# Patient Record
Sex: Male | Born: 1990 | Race: Black or African American | Hispanic: No | Marital: Single | Smoking: Current every day smoker
Health system: Southern US, Community
[De-identification: ages and names within clinical notes are randomized; demographics above are authoritative.]

## PROBLEM LIST (undated history)

## (undated) DIAGNOSIS — J349 Unspecified disorder of nose and nasal sinuses: Secondary | ICD-10-CM

## (undated) HISTORY — PX: HAND SURGERY: SHX662

## (undated) HISTORY — PX: KNEE SURGERY: SHX244

---
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
## 2010-03-14 IMAGING — CR DG CHEST 2V
2 series · 2 of 2 positions shown · non-contrast
Comparison: Chest radiograph 01/14/2009

CLINICAL DATA: Left lower lobe collapse

CHEST - 2 VIEW

[w chest pa]
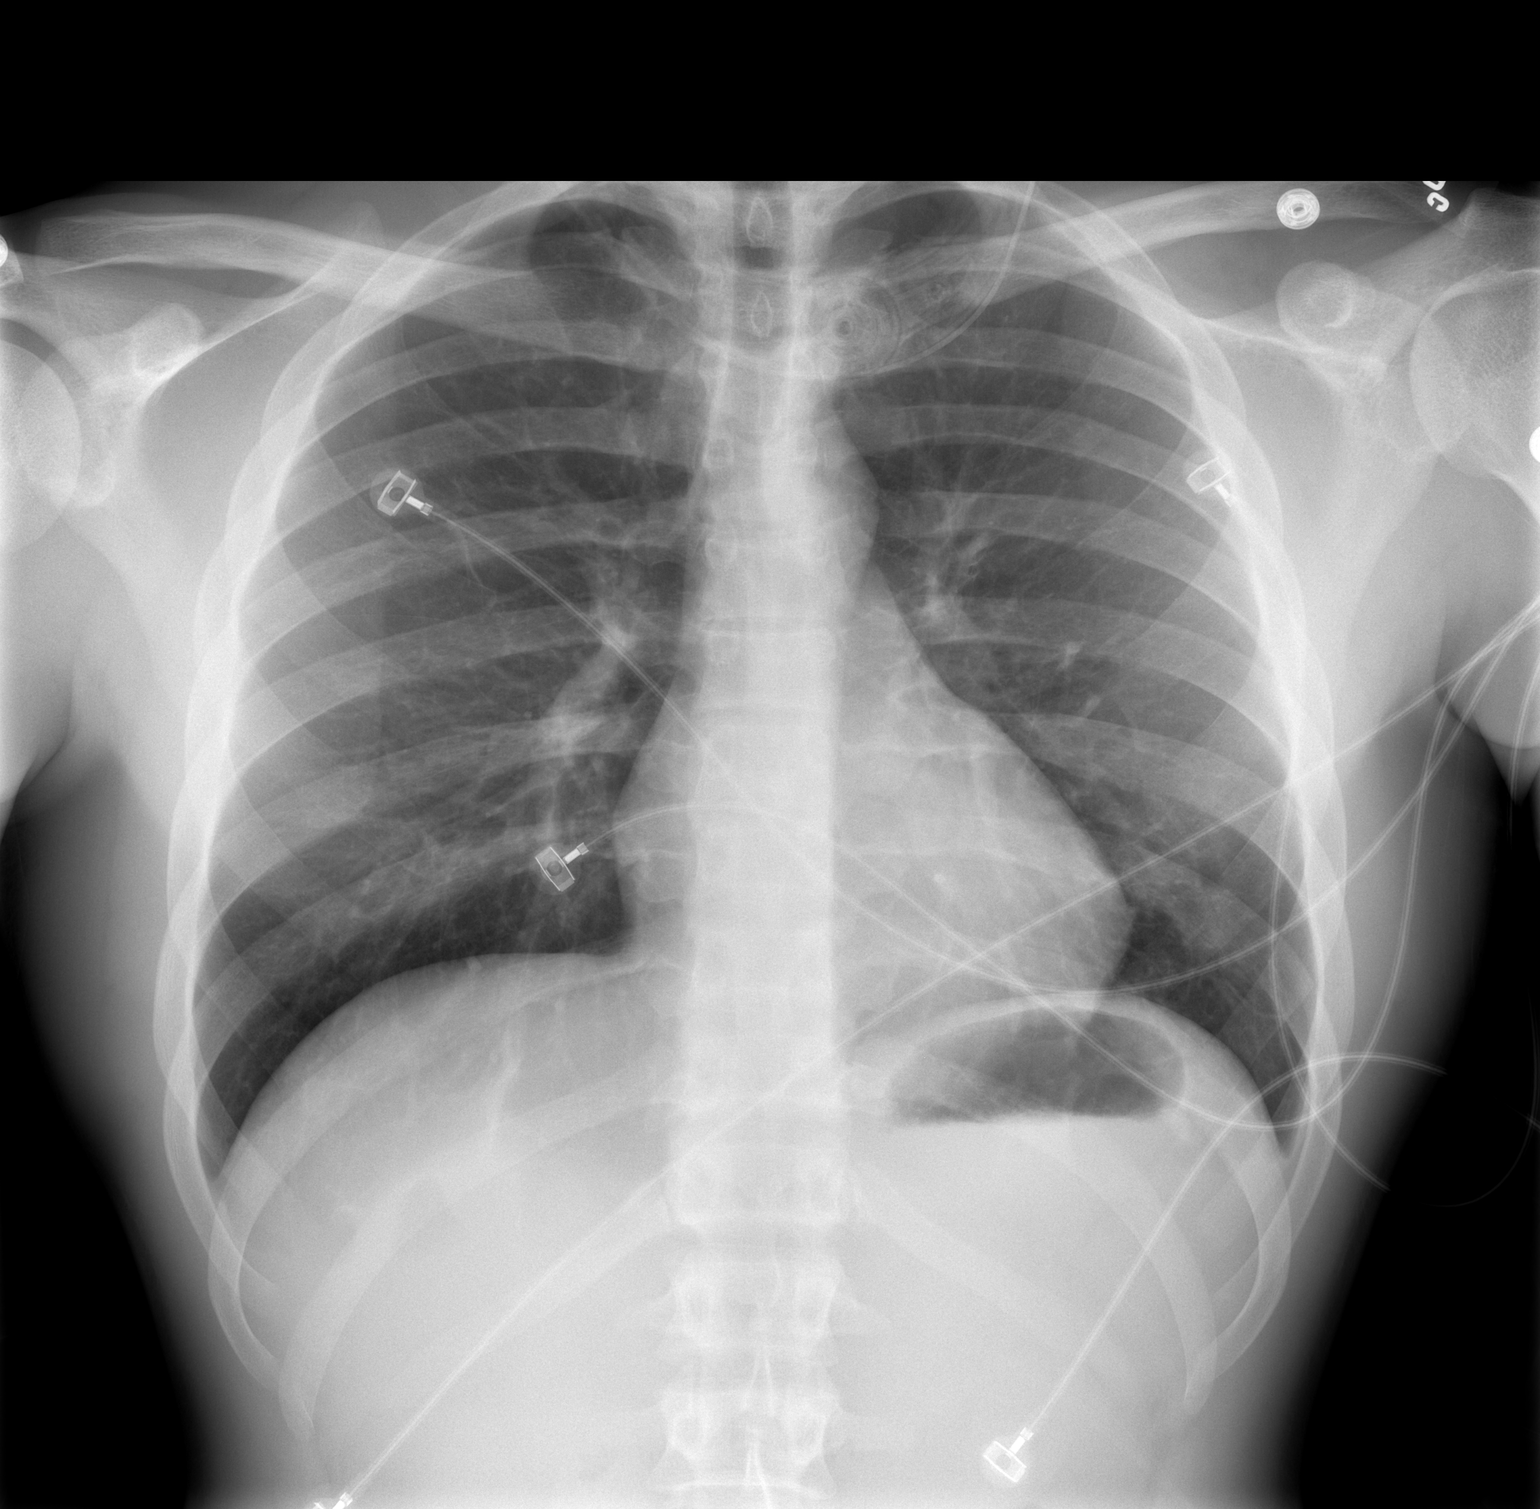

[w chest lat]
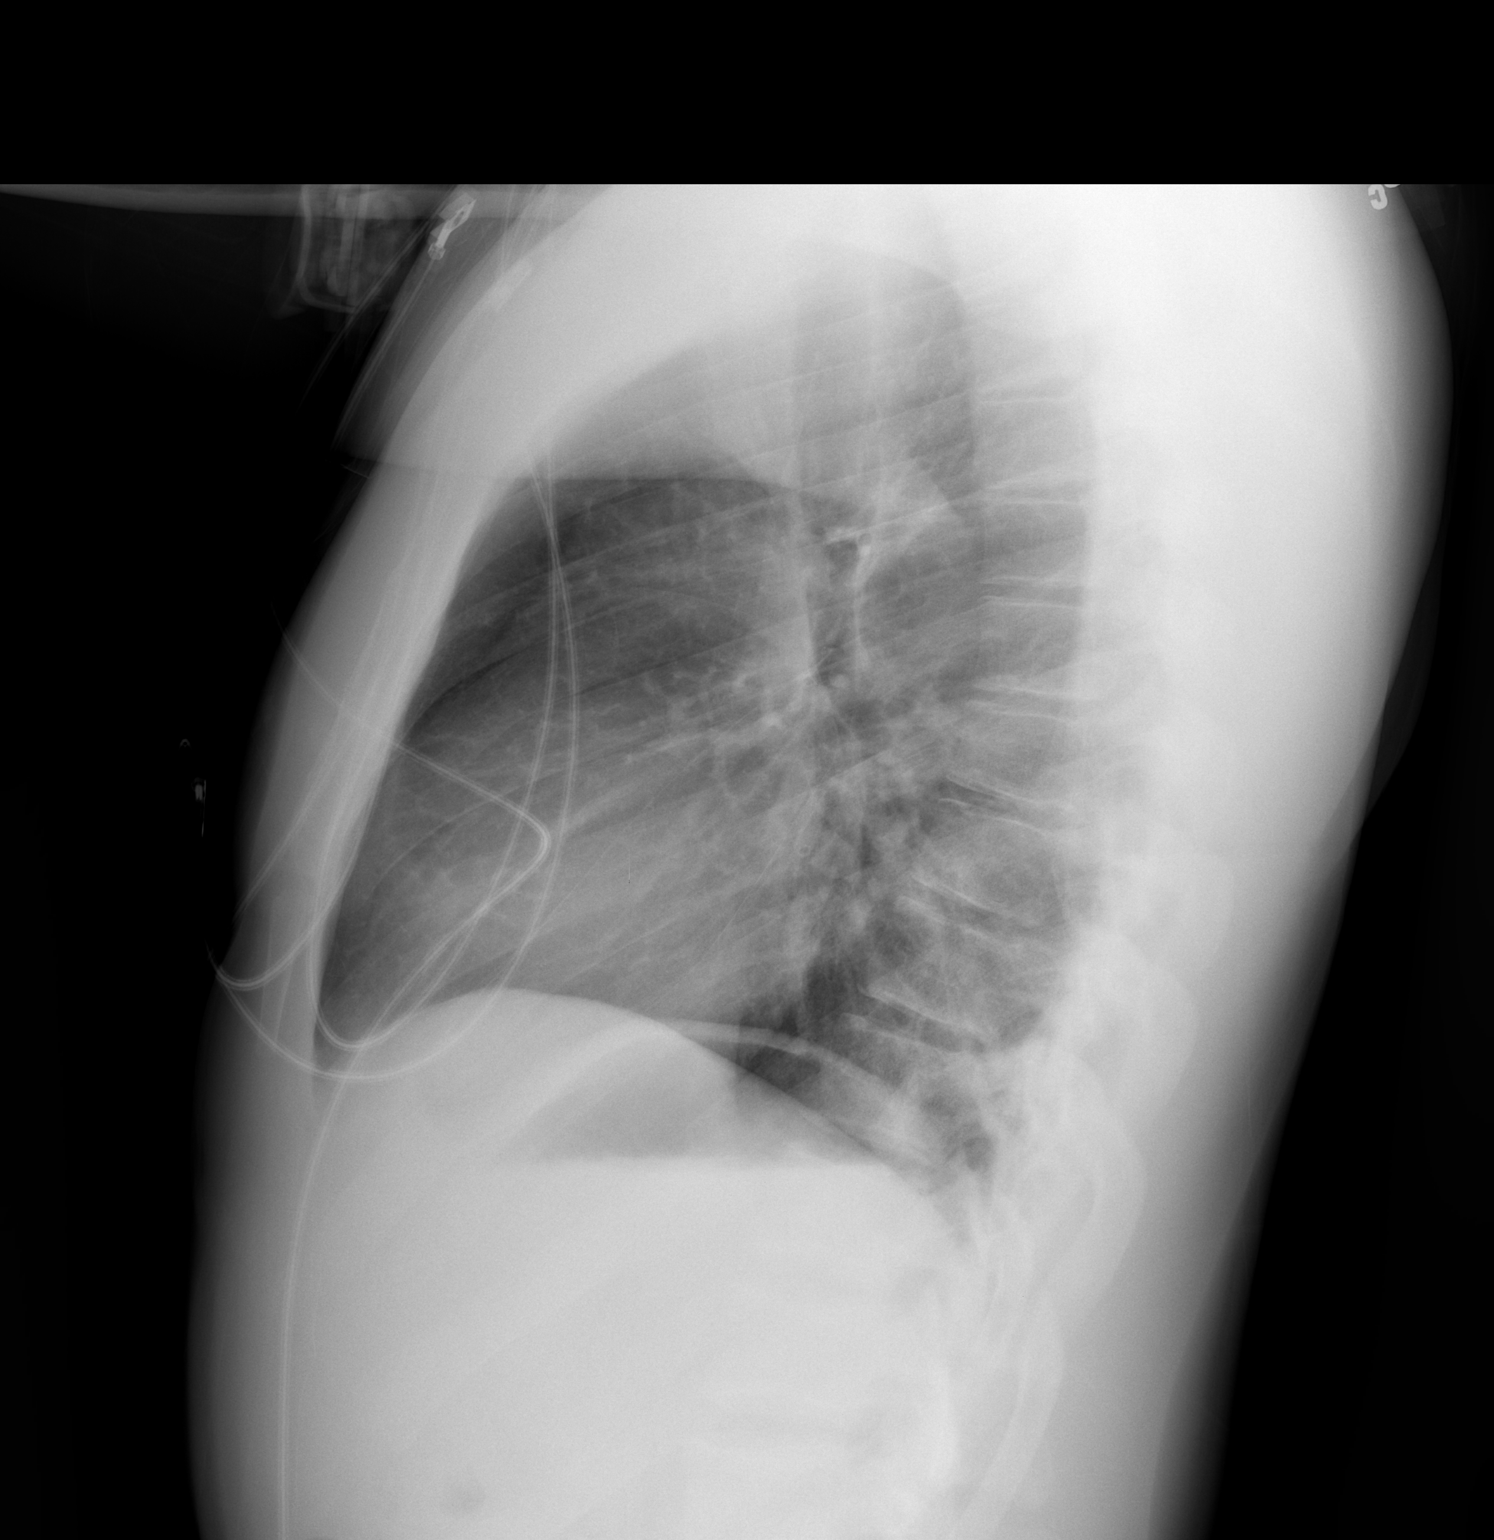

[2 of 2 positions shown; findings below may reference images not displayed]

FINDINGS: Normal mediastinum and cardiac silhouette.  No evidence
of pneumothorax.  Interval resolution of the left lower lobe
atelectasis.  No evidence of  rib fracture. Potential mild residual
atelectasis at the left lung base posteriorly versus small amount
of air space disease or contusion.
IMPRESSION: 1.  Interval expansion of the left lower lobe.
2.  No evidence of pneumothorax.

## 2010-06-07 LAB — CBC
HCT: 41.3 % (ref 39.0–52.0)
HCT: 41.6 % (ref 39.0–52.0)
HCT: 45 % (ref 39.0–52.0)
Hemoglobin: 14.3 g/dL (ref 13.0–17.0)
Hemoglobin: 15.4 g/dL (ref 13.0–17.0)
MCHC: 34.3 g/dL (ref 30.0–36.0)
MCHC: 34.5 g/dL (ref 30.0–36.0)
MCV: 93.6 fL (ref 78.0–100.0)
MCV: 94.6 fL (ref 78.0–100.0)
MCV: 94.6 fL (ref 78.0–100.0)
Platelets: 161 10*3/uL (ref 150–400)
Platelets: 172 10*3/uL (ref 150–400)
Platelets: 188 10*3/uL (ref 150–400)
RBC: 4.81 MIL/uL (ref 4.22–5.81)
RDW: 12.2 % (ref 11.5–15.5)
WBC: 10.3 10*3/uL (ref 4.0–10.5)
WBC: 10.4 10*3/uL (ref 4.0–10.5)
WBC: 14.8 10*3/uL — ABNORMAL HIGH (ref 4.0–10.5)

## 2010-06-07 LAB — TYPE AND SCREEN: Antibody Screen: NEGATIVE

## 2010-06-07 LAB — URINALYSIS, ROUTINE W REFLEX MICROSCOPIC
Nitrite: NEGATIVE
Specific Gravity, Urine: 1.023 (ref 1.005–1.030)
Urobilinogen, UA: 1 mg/dL (ref 0.0–1.0)
pH: 6 (ref 5.0–8.0)

## 2010-06-07 LAB — COMPREHENSIVE METABOLIC PANEL
AST: 52 U/L — ABNORMAL HIGH (ref 0–37)
CO2: 26 mEq/L (ref 19–32)
Calcium: 9.1 mg/dL (ref 8.4–10.5)
Chloride: 108 mEq/L (ref 96–112)
Creatinine, Ser: 1.34 mg/dL (ref 0.4–1.5)
GFR calc non Af Amer: >60 mL/min (ref >60)
Glucose, Bld: 105 mg/dL — ABNORMAL HIGH (ref 70–99)
Total Bilirubin: 1 mg/dL (ref 0.3–1.2)

## 2010-06-07 LAB — PROTIME-INR: Prothrombin Time: 14.1 seconds (ref 11.6–15.2)

## 2010-06-07 LAB — POCT I-STAT, CHEM 8
BUN: 9 mg/dL (ref 6–23)
Calcium, Ion: 1.14 mmol/L (ref 1.12–1.32)
Creatinine, Ser: 1.4 mg/dL (ref 0.4–1.5)
TCO2: 25 mmol/L (ref 0–100)

## 2010-06-07 LAB — POCT I-STAT 3, ART BLOOD GAS (G3+)
O2 Saturation: 100 %
Patient temperature: 98.6
TCO2: 27 mmol/L (ref 0–100)

## 2010-06-07 LAB — URINE MICROSCOPIC-ADD ON

## 2010-06-07 LAB — LACTIC ACID, PLASMA: Lactic Acid, Venous: 2.5 mmol/L — ABNORMAL HIGH (ref 0.5–2.2)

## 2010-06-07 LAB — BASIC METABOLIC PANEL
BUN: 7 mg/dL (ref 6–23)
Creatinine, Ser: 1.23 mg/dL (ref 0.4–1.5)
GFR calc non Af Amer: >60 mL/min (ref >60)

## 2010-06-07 LAB — APTT: aPTT: 30 seconds (ref 24–37)

## 2011-06-14 ENCOUNTER — Encounter (HOSPITAL_COMMUNITY): Payer: Self-pay

## 2011-06-14 ENCOUNTER — Emergency Department (HOSPITAL_COMMUNITY)
Admission: EM | Admit: 2011-06-14 | Discharge: 2011-06-14 | Disposition: A | Discharge status: Home / Self Care | Payer: BC Managed Care – PPO | Attending: Emergency Medicine | Admitting: Emergency Medicine

## 2011-06-14 DIAGNOSIS — R82998 Other abnormal findings in urine: Secondary | ICD-10-CM | POA: Insufficient documentation

## 2011-06-14 DIAGNOSIS — R45851 Suicidal ideations: Secondary | ICD-10-CM | POA: Insufficient documentation

## 2011-06-14 DIAGNOSIS — R8271 Bacteriuria: Secondary | ICD-10-CM

## 2011-06-14 DIAGNOSIS — F17200 Nicotine dependence, unspecified, uncomplicated: Secondary | ICD-10-CM | POA: Insufficient documentation

## 2011-06-14 HISTORY — DX: Unspecified disorder of nose and nasal sinuses: J34.9

## 2011-06-14 LAB — URINALYSIS, ROUTINE W REFLEX MICROSCOPIC
Bilirubin Urine: NEGATIVE
Ketones, ur: NEGATIVE mg/dL
Nitrite: NEGATIVE
Protein, ur: NEGATIVE mg/dL
Urobilinogen, UA: 1 mg/dL (ref 0.0–1.0)

## 2011-06-14 LAB — CBC
HCT: 44.3 % (ref 39.0–52.0)
Hemoglobin: 15.2 g/dL (ref 13.0–17.0)
MCHC: 34.3 g/dL (ref 30.0–36.0)

## 2011-06-14 LAB — BASIC METABOLIC PANEL
BUN: 15 mg/dL (ref 6–23)
CO2: 29 mEq/L (ref 19–32)
GFR calc non Af Amer: 79 mL/min — ABNORMAL LOW (ref >90)
Glucose, Bld: 90 mg/dL (ref 70–99)
Potassium: 4.4 mEq/L (ref 3.5–5.1)

## 2011-06-14 LAB — ETHANOL: Alcohol, Ethyl (B): <11 mg/dL (ref 0–11)

## 2011-06-14 LAB — RAPID URINE DRUG SCREEN, HOSP PERFORMED
Amphetamines: NOT DETECTED
Barbiturates: NOT DETECTED
Benzodiazepines: NOT DETECTED
Tetrahydrocannabinol: POSITIVE — AB

## 2011-06-14 MED ORDER — ACETAMINOPHEN 325 MG PO TABS: 650.0000 mg | ORAL_TABLET | ORAL | Status: DC | PRN

## 2011-06-14 MED ORDER — ONDANSETRON HCL 4 MG PO TABS: 4.0000 mg | ORAL_TABLET | Freq: Three times a day (TID) | ORAL | Status: DC | PRN

## 2011-06-14 MED ORDER — IBUPROFEN 600 MG PO TABS: 600.0000 mg | ORAL_TABLET | Freq: Three times a day (TID) | ORAL | Status: DC | PRN

## 2011-06-14 MED ORDER — NICOTINE 21 MG/24HR TD PT24: 21.0000 mg | MEDICATED_PATCH | Freq: Every day | TRANSDERMAL | Status: DC

## 2011-06-14 MED ORDER — ALUM & MAG HYDROXIDE-SIMETH 200-200-20 MG/5ML PO SUSP: 30.0000 mL | ORAL | Status: DC | PRN
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
## 2011-06-14 NOTE — ED Notes (Signed)
Pt dc'd w/ writen instructions.  Pt encouraged to return/seek treatment for any thoughts of harming his self or others.  Pt verbalized understanding.

## 2011-06-14 NOTE — ED Notes (Signed)
Patient has one bag of belongings in locker 34.

## 2011-06-14 NOTE — ED Notes (Signed)
Pt care assumed, obtained verbal report.  Pt resting comfortably in room.  Family/friend at bedside.  Pt is calm and cooperative at this time.

## 2011-06-14 NOTE — ED Notes (Signed)
PA in to see

## 2011-06-14 NOTE — ED Notes (Signed)
Belongings returned

## 2011-06-14 NOTE — Discharge Instructions (Signed)
As we discussed, you should return to the ER for further treatment if you begin to have thoughts of hurting yourself or anyone else.   There was bacteria in your urine. The sample is being sent to help determine what kind of bacteria is present. Please call the hospital in 4 days if you have not heard anything from [place] to find out your results.

## 2011-06-14 NOTE — ED Notes (Signed)
Artis Flock, PA in room with pt

## 2011-06-14 NOTE — Discharge Planning (Signed)
CSW requested telepsych on patient. Pending return call.  Manson Passey Vanette Noguchi ANN S , MSW, LCSWA 06/14/2011 7:55 AM [phone]
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
## 2011-06-14 NOTE — ED Notes (Signed)
telepsych in progress 

## 2011-06-14 NOTE — ED Provider Notes (Signed)
Medical screening examination/treatment/procedure(s) were performed by non-physician practitioner and as supervising physician I was immediately available for consultation/collaboration.   Analisia Kingsford R Armend Hochstatter, MD 06/14/11 2320 

## 2011-06-15 LAB — GC/CHLAMYDIA PROBE AMP, URINE: GC Probe Amp, Urine: NEGATIVE

## 2011-06-15 LAB — URINE CULTURE
Culture  Setup Time: 201304110843
Special Requests: NORMAL

## 2011-06-16 NOTE — ED Notes (Signed)
+  Chlamydia Chart sent to EDP office for review.  

## 2011-06-16 NOTE — ED Notes (Signed)
+   Urine Chart sent to EDP office for review. 

## 2011-06-21 NOTE — ED Notes (Signed)
Prescription for azithromycin 1000mg  po x1 dose, no refills, called in to [facility] on [address] at [phone] per josh geiple, [place].

## 2011-06-21 NOTE — ED Notes (Signed)
Chart back from EDP office ..prescription for called in to [phone]

## 2015-02-19 ENCOUNTER — Encounter (HOSPITAL_COMMUNITY): Payer: Self-pay | Admitting: Emergency Medicine

## 2015-02-19 ENCOUNTER — Emergency Department (HOSPITAL_COMMUNITY)
Admission: EM | Admit: 2015-02-19 | Discharge: 2015-02-19 | Disposition: A | Discharge status: Home / Self Care | Payer: Commercial Managed Care - HMO | Attending: Emergency Medicine | Admitting: Emergency Medicine

## 2015-02-19 DIAGNOSIS — L989 Disorder of the skin and subcutaneous tissue, unspecified: Secondary | ICD-10-CM | POA: Insufficient documentation

## 2015-02-19 DIAGNOSIS — K6289 Other specified diseases of anus and rectum: Secondary | ICD-10-CM | POA: Diagnosis present

## 2015-02-19 DIAGNOSIS — F17200 Nicotine dependence, unspecified, uncomplicated: Secondary | ICD-10-CM | POA: Diagnosis not present

## 2015-02-19 DIAGNOSIS — N4889 Other specified disorders of penis: Secondary | ICD-10-CM | POA: Diagnosis not present

## 2015-02-19 DIAGNOSIS — K130 Diseases of lips: Secondary | ICD-10-CM | POA: Diagnosis not present

## 2015-02-19 MED ORDER — CLOTRIMAZOLE 1 % EX CREA: TOPICAL_CREAM | CUTANEOUS | Status: DC

## 2015-02-19 MED ORDER — BELLADONNA-OPIUM 16.2-30 MG RE SUPP: 30.0000 mg | Freq: Two times a day (BID) | RECTAL | Status: DC | PRN

## 2015-02-19 MED ORDER — FLUCONAZOLE 100 MG PO TABS
150.0000 mg | ORAL_TABLET | Freq: Once | ORAL | Status: AC
  Administered 2015-02-19: 150 mg via ORAL
  Filled 2015-02-19: qty 2

## 2015-02-19 NOTE — ED Notes (Signed)
Pt st's he has a rash around him rectum that is very irritated.  St's he gets a sharp pain in his rectum.  Pt denies any discharge from penis.

## 2015-02-19 NOTE — Discharge Instructions (Signed)
As discussed, your evaluation today has been largely reassuring. There is evidence for a skin yeast infection. You have received the initial medication for this.  If the skin does not improve, please use the prescribed cream. In addition, please use the prescribed suppositories for additional pain control. If your pain does not improve, please be sure to follow-up with our gastroenterologist. Regardless, please be sure to follow-up with your primary care center for appropriate ongoing management.  Recall also that there are additional lab tests ending. We will be made aware of abnormal results.

## 2015-02-19 NOTE — ED Provider Notes (Signed)
CSN: [id number]     Arrival date & time 02/19/15  2055 History   First MD Initiated Contact with Patient 02/19/15 2114     Chief Complaint  Patient presents with  . Hemorrhoids     (Consider location/radiation/quality/duration/timing/severity/associated sxs/prior Treatment) HPI Patient presents with concern of rectal discomfort, glands of the penis discomfort. Patient acknowledges a history of prior STD, denies current dysuria, hematuria and scrotal swelling or pain. He notes over the past days at least, he has developed increasing discomfort around the rectum, and the glans penis. He is sexually active with females, inconsistently with protection. Patient states that prior to the onset of his discomfort he did have sore throat, but this has improved. No current dyspnea, dysphagia, fever, chills, vision changes.  Past Medical History  Diagnosis Date  . Sinus trouble    Past Surgical History  Procedure Laterality Date  . Hand surgery    . Knee surgery     No family history on file. Social History  Substance Use Topics  . Smoking status: Current Every Day Smoker -- 1.00 packs/day  . Smokeless tobacco: None  . Alcohol Use: Yes     Comment: Once Week    Review of Systems  Constitutional: Negative for fever.  Respiratory: Negative for shortness of breath.   Cardiovascular: Negative for chest pain.  Musculoskeletal:       Negative aside from HPI  Skin:       Negative aside from HPI  Allergic/Immunologic: Negative for immunocompromised state.  Neurological: Negative for weakness.      Allergies  Review of patient's allergies indicates no known allergies.  Home Medications   Prior to Admission medications   Not on File   BP 132/89 mmHg  Pulse 62  Temp(Src) 97.9 F (36.6 C)  Resp 16  Ht 5\' 8"  (1.727 m)  Wt 178 lb (80.74 kg)  BMI 27.07 kg/m2  SpO2 98% Physical Exam  Constitutional: He is oriented to person, place, and time. He appears well-developed. No  distress.  HENT:  Head: Normocephalic and atraumatic.    Eyes: Conjunctivae and EOM are normal.  Cardiovascular: Normal rate and regular rhythm.   Pulmonary/Chest: Effort normal. No stridor. No respiratory distress.  Abdominal: He exhibits no distension.  Genitourinary:    Right testis shows no swelling. Left testis shows no swelling.     Musculoskeletal: He exhibits no edema.  Neurological: He is alert and oriented to person, place, and time.  Skin: Skin is warm and dry.  Psychiatric: He has a normal mood and affect.  Nursing note and vitals reviewed.   ED Course  Procedures (including critical care time) Labs Review Labs Reviewed  RPR  HIV ANTIBODY (ROUTINE TESTING)  GC/CHLAMYDIA PROBE AMP ([place]     MDM  Patient presents with concern of rectal discomfort, cutaneous changes on the penis. Patient has no evidence for bacteremia or sepsis. No obvious stigmata of herpes, chancre.  Patient had STD testing performed, was started on medication for both yeast infection, as well as his rectal discomfort which may be secondary to internal hemorrhoid versus internal fissure. Patient provided follow-up with gastrology, primary care.  Gerhard Munchobert Rielyn Krupinski, MD 02/19/15 2159

## 2015-02-20 LAB — HIV ANTIBODY (ROUTINE TESTING W REFLEX): HIV SCREEN 4TH GENERATION: NONREACTIVE

## 2015-02-20 LAB — RPR: RPR: NONREACTIVE

## 2015-02-21 LAB — GC/CHLAMYDIA PROBE AMP (~~LOC~~) NOT AT ARMC
CHLAMYDIA, DNA PROBE: NEGATIVE
Neisseria Gonorrhea: NEGATIVE

## 2017-01-25 ENCOUNTER — Other Ambulatory Visit: Payer: Self-pay

## 2017-01-25 ENCOUNTER — Encounter (HOSPITAL_COMMUNITY): Payer: Self-pay | Admitting: Emergency Medicine

## 2017-01-25 ENCOUNTER — Emergency Department (HOSPITAL_COMMUNITY)
Admission: EM | Admit: 2017-01-25 | Discharge: 2017-01-25 | Disposition: A | Discharge status: Home / Self Care | Payer: Commercial Managed Care - HMO | Attending: Emergency Medicine | Admitting: Emergency Medicine

## 2017-01-25 DIAGNOSIS — R109 Unspecified abdominal pain: Secondary | ICD-10-CM | POA: Insufficient documentation

## 2017-01-25 DIAGNOSIS — R197 Diarrhea, unspecified: Secondary | ICD-10-CM | POA: Insufficient documentation

## 2017-01-25 DIAGNOSIS — F17200 Nicotine dependence, unspecified, uncomplicated: Secondary | ICD-10-CM | POA: Insufficient documentation

## 2017-01-25 DIAGNOSIS — R112 Nausea with vomiting, unspecified: Secondary | ICD-10-CM | POA: Insufficient documentation

## 2017-01-25 LAB — CBC WITH DIFFERENTIAL/PLATELET
BASOS ABS: 0 10*3/uL (ref 0.0–0.1)
Basophils Relative: 0 %
Eosinophils Absolute: 0 10*3/uL (ref 0.0–0.7)
Eosinophils Relative: 0 %
HEMATOCRIT: 45.6 % (ref 39.0–52.0)
HEMOGLOBIN: 15.8 g/dL (ref 13.0–17.0)
Lymphocytes Relative: 8 %
Lymphs Abs: 1.2 10*3/uL (ref 0.7–4.0)
MCH: 31.7 pg (ref 26.0–34.0)
MCHC: 34.6 g/dL (ref 30.0–36.0)
MCV: 91.6 fL (ref 78.0–100.0)
Monocytes Absolute: 0.5 10*3/uL (ref 0.1–1.0)
Monocytes Relative: 3 %
NEUTROS ABS: 13.3 10*3/uL — AB (ref 1.7–7.7)
Neutrophils Relative %: 89 %
PLATELETS: 213 10*3/uL (ref 150–400)
RBC: 4.98 MIL/uL (ref 4.22–5.81)
RDW: 11.9 % (ref 11.5–15.5)
WBC: 15 10*3/uL — AB (ref 4.0–10.5)

## 2017-01-25 LAB — BASIC METABOLIC PANEL
ANION GAP: 12 (ref 5–15)
BUN: 11 mg/dL (ref 6–20)
CHLORIDE: 103 mmol/L (ref 101–111)
CO2: 23 mmol/L (ref 22–32)
Calcium: 9.6 mg/dL (ref 8.9–10.3)
Creatinine, Ser: 1.41 mg/dL — ABNORMAL HIGH (ref 0.61–1.24)
GFR calc Af Amer: >60 mL/min (ref >60)
GLUCOSE: 108 mg/dL — AB (ref 65–99)
POTASSIUM: 3.8 mmol/L (ref 3.5–5.1)
Sodium: 138 mmol/L (ref 135–145)

## 2017-01-25 LAB — LIPASE, BLOOD: Lipase: 20 U/L (ref 11–51)

## 2017-01-25 MED ORDER — SODIUM CHLORIDE 0.9 % IV BOLUS (SEPSIS)
1000.0000 mL | Freq: Once | INTRAVENOUS | Status: AC
  Administered 2017-01-25: 1000 mL via INTRAVENOUS

## 2017-01-25 MED ORDER — ACETAMINOPHEN 325 MG PO TABS
650.0000 mg | ORAL_TABLET | Freq: Once | ORAL | Status: AC
  Administered 2017-01-25: 650 mg via ORAL
  Filled 2017-01-25: qty 2

## 2017-01-25 MED ORDER — METOCLOPRAMIDE HCL 5 MG/ML IJ SOLN
10.0000 mg | Freq: Once | INTRAMUSCULAR | Status: AC
  Administered 2017-01-25: 10 mg via INTRAVENOUS
  Filled 2017-01-25: qty 2

## 2017-01-25 MED ORDER — ONDANSETRON 4 MG PO TBDP
ORAL_TABLET | ORAL | Status: AC
  Filled 2017-01-25: qty 1

## 2017-01-25 MED ORDER — DIPHENHYDRAMINE HCL 50 MG/ML IJ SOLN
25.0000 mg | Freq: Once | INTRAMUSCULAR | Status: AC
  Administered 2017-01-25: 25 mg via INTRAVENOUS
  Filled 2017-01-25: qty 1

## 2017-01-25 MED ORDER — LOPERAMIDE HCL 2 MG PO CAPS
4.0000 mg | ORAL_CAPSULE | Freq: Once | ORAL | Status: AC
  Administered 2017-01-25: 4 mg via ORAL
  Filled 2017-01-25: qty 2

## 2017-01-25 MED ORDER — ONDANSETRON 4 MG PO TBDP
4.0000 mg | ORAL_TABLET | Freq: Once | ORAL | Status: AC
  Administered 2017-01-25: 4 mg via ORAL

## 2017-01-25 MED ORDER — ONDANSETRON HCL 4 MG/2ML IJ SOLN
4.0000 mg | Freq: Once | INTRAMUSCULAR | Status: AC
  Administered 2017-01-25: 4 mg via INTRAVENOUS
  Filled 2017-01-25: qty 2

## 2017-01-25 MED ORDER — METOCLOPRAMIDE HCL 10 MG PO TABS: 10.0000 mg | ORAL_TABLET | Freq: Four times a day (QID) | ORAL | 0 refills | Status: AC | PRN

## 2017-01-25 NOTE — ED Provider Notes (Signed)
[facility] Provider Note   CSN: [id number] Arrival date & time: 01/25/17  0114     History   Chief Complaint Chief Complaint  Patient presents with  . Nausea  . Emesis  . Diarrhea    HPI Jonathan Rodgers is a 26 y.o. male.  The history is provided by the patient.  He had onset yesterday afternoon of vague abdominal pain which was followed by nausea, vomiting, diarrhea.  He has vomited at least 10 times and had about 6 loose bowel movements.  Abdominal pain is rated at 6/10.  He denies fever, chills, sweats.  He has no known sick contacts.  He thinks it might be food poisoning, but is not aware of anyone else who got ill after eating the same food.  He was given a dose of ondansetron in triage with slight improvement of nausea.  He continues to have a sense of rectal urgency and continues to have nausea.  Past Medical History:  Diagnosis Date  . Sinus trouble     There are no active problems to display for this patient.   Past Surgical History:  Procedure Laterality Date  . HAND SURGERY    . KNEE SURGERY         Home Medications    Prior to Admission medications   Medication Sig Start Date End Date Taking? Authorizing Provider  belladonna-opium (B&O SUPPRETTES) 16.2-30 MG suppository Place 1 suppository rectally 2 (two) times daily as needed for pain. 02/19/15   Gerhard MunchLockwood, Robert, MD  clotrimazole (LOTRIMIN) 1 % cream Apply to affected area 2 times daily 02/19/15   Gerhard MunchLockwood, Robert, MD    Family History History reviewed. No pertinent family history.  Social History Social History   Tobacco Use  . Smoking status: Current Every Day Smoker    Packs/day: 1.00  Substance Use Topics  . Alcohol use: Yes    Comment: Once Week  . Drug use: No     Allergies   Patient has no known allergies.   Review of Systems Review of Systems  All other systems reviewed and are negative.    Physical Exam Updated Vital Signs BP  129/66   Pulse (!) 56   Temp 97.6 F (36.4 C) (Oral)   Resp 20   Ht 5\' 8"  (1.727 m)   Wt 80.7 kg (178 lb)   SpO2 99%   BMI 27.06 kg/m   Physical Exam  Nursing note and vitals reviewed.  26 year old male, resting comfortably and in no acute distress. Vital signs are significant for mild bradycardia. Oxygen saturation is 99%, which is normal. Head is normocephalic and atraumatic. PERRLA, EOMI. Oropharynx is clear. Neck is nontender and supple without adenopathy or JVD. Back is nontender and there is no CVA tenderness. Lungs are clear without rales, wheezes, or rhonchi. Chest is nontender. Heart has regular rate and rhythm without murmur. Abdomen is soft, flat, nontender without masses or hepatosplenomegaly and peristalsis is hypoactive. Extremities have no cyanosis or edema, full range of motion is present. Skin is warm and dry without rash. Neurologic: Mental status is normal, cranial nerves are intact, there are no motor or sensory deficits.  ED Treatments / Results  Labs (all labs ordered are listed, but only abnormal results are displayed) Labs Reviewed  CBC WITH DIFFERENTIAL/PLATELET - Abnormal; Notable for the following components:      Result Value   WBC 15.0 (*)    Neutro Abs 13.3 (*)  All other components within normal limits  BASIC METABOLIC PANEL - Abnormal; Notable for the following components:   Glucose, Bld 108 (*)    Creatinine, Ser 1.41 (*)    All other components within normal limits  LIPASE, BLOOD   Procedures Procedures (including critical care time)  Medications Ordered in ED Medications  ondansetron (ZOFRAN-ODT) 4 MG disintegrating tablet (  Canceled Entry 01/25/17 0145)  ondansetron (ZOFRAN-ODT) disintegrating tablet 4 mg (4 mg Oral Given 01/25/17 0129)  sodium chloride 0.9 % bolus 1,000 mL (0 mLs Intravenous Stopped 01/25/17 0614)  ondansetron (ZOFRAN) injection 4 mg (4 mg Intravenous Given 01/25/17 0435)  loperamide (IMODIUM) capsule 4 mg (4  mg Oral Given 01/25/17 0435)  sodium chloride 0.9 % bolus 1,000 mL (1,000 mLs Intravenous New Bag/Given 01/25/17 0726)  metoCLOPramide (REGLAN) injection 10 mg (10 mg Intravenous Given 01/25/17 0727)  diphenhydrAMINE (BENADRYL) injection 25 mg (25 mg Intravenous Given 01/25/17 0728)  acetaminophen (TYLENOL) tablet 650 mg (650 mg Oral Given 01/25/17 0730)     Initial Impression / Assessment and Plan / ED Course  I have reviewed the triage vital signs and the nursing notes.  Pertinent labs & imaging results that were available during my care of the patient were reviewed by me and considered in my medical decision making (see chart for details).  Nausea, vomiting, diarrhea in pattern consistent with either food poisoning or viral gastroenteritis.  No red flags to suggest more serious illness.  Initial laboratory work shows mild renal insufficiency, but creatinine not significantly changed from baseline.  He is given IV fluids, IV ondansetron, and oral loperamide and will be reassessed.  Old records are reviewed, and he has no relevant past visits.  Following above-noted treatment, he is still having some nausea and some diarrhea.  He is also having some chills.  He is given additional IV fluid and metoclopramide as well as oral acetaminophen.  He is feeling much better following that.  He is discharged with prescription for metoclopramide and advised to use over-the-counter loperamide as needed.  Given work release for the next 24 hours.  Final Clinical Impressions(s) / ED Diagnoses   Final diagnoses:  Nausea vomiting and diarrhea    ED Discharge Orders        Ordered    metoCLOPramide (REGLAN) 10 MG tablet  Every 6 hours PRN     01/25/17 0801       Dione BoozeGlick, Janneth Krasner, MD 01/25/17 [phone]

## 2017-01-25 NOTE — ED Triage Notes (Signed)
Pt starting having some nausea, vomiting and diarrhea after dinner today, no fever reported.

## 2017-01-25 NOTE — ED Notes (Signed)
EDP aware patient complaining of chest pain

## 2017-01-25 NOTE — Discharge Instructions (Signed)
Take loperamide (Imodium A-D) as needed for diarrhea. ?

## 2023-04-14 ENCOUNTER — Other Ambulatory Visit: Payer: Self-pay

## 2023-04-14 ENCOUNTER — Emergency Department (HOSPITAL_COMMUNITY)
Admission: EM | Admit: 2023-04-14 | Discharge: 2023-04-14 | Disposition: A | Discharge status: Home / Self Care | Payer: Self-pay | Attending: Emergency Medicine | Admitting: Emergency Medicine

## 2023-04-14 ENCOUNTER — Encounter (HOSPITAL_COMMUNITY): Payer: Self-pay

## 2023-04-14 DIAGNOSIS — J101 Influenza due to other identified influenza virus with other respiratory manifestations: Secondary | ICD-10-CM | POA: Insufficient documentation

## 2023-04-14 DIAGNOSIS — Z1152 Encounter for screening for COVID-19: Secondary | ICD-10-CM | POA: Insufficient documentation

## 2023-04-14 LAB — RESP PANEL BY RT-PCR (RSV, FLU A&B, COVID)  RVPGX2
Influenza A by PCR: POSITIVE — AB
Influenza B by PCR: NEGATIVE
Resp Syncytial Virus by PCR: NEGATIVE
SARS Coronavirus 2 by RT PCR: NEGATIVE

## 2023-04-14 MED ORDER — BENZONATATE 100 MG PO CAPS: 100.0000 mg | ORAL_CAPSULE | Freq: Three times a day (TID) | ORAL | 0 refills | Status: AC

## 2023-04-14 MED ORDER — OSELTAMIVIR PHOSPHATE 75 MG PO CAPS: 75.0000 mg | ORAL_CAPSULE | Freq: Two times a day (BID) | ORAL | 0 refills | Status: AC

## 2023-04-14 MED ORDER — ACETAMINOPHEN 500 MG PO TABS
1000.0000 mg | ORAL_TABLET | Freq: Once | ORAL | Status: AC
  Administered 2023-04-14: 1000 mg via ORAL
  Filled 2023-04-14: qty 2

## 2023-04-14 NOTE — ED Provider Triage Note (Signed)
 Emergency Medicine Provider Triage Evaluation Note  Jonathan Rodgers , a 33 y.o. male  was evaluated in triage.  Pt complains of URI.  Review of Systems  Positive:  Negative:   Physical Exam  There were no vitals taken for this visit. Gen:   Awake, no distress   Resp:  Normal effort  MSK:   Moves extremities without difficulty  Other:    Medical Decision Making  Medically screening exam initiated at 1:56 PM.  Appropriate orders placed.  Jonathan Rodgers was informed that the remainder of the evaluation will be completed by another provider, this initial triage assessment does not replace that evaluation, and the importance of remaining in the ED until their evaluation is complete.   Rhinorrhea, congestion, sore throat, body aches, cough, diarrhea x2 days.   Jonathan Rodgers, [place] 04/14/23 1357

## 2023-04-14 NOTE — ED Triage Notes (Signed)
 General unwell feeling for 3 days, cough, fever, chills, body aches.

## 2023-04-14 NOTE — Discharge Instructions (Signed)
 You have influenza A  Please rest at home for several days.  Expect fever and cough for at least 1 to 2 weeks  Take Tamiflu  as prescribed  You may try Tessalon  Perles as needed  Please follow-up with your doctor  Return to ER if you have worse cough or fever or trouble breathing

## 2023-04-14 NOTE — ED Provider Notes (Signed)
 [place] EMERGENCY DEPARTMENT AT [facility] Provider Note   CSN: [id number] Arrival date & time: 04/14/23  1347     History  Chief Complaint  Patient presents with   Generalized Body Aches    Jonathan Rodgers is a 33 y.o. male here presenting with bodyaches.  Patient has bodyaches for the last 2 to 3 days.  Patient also has nonproductive cough.  Patient states that he has chills constantly.  Patient states that he is around other people who is sick as well.  The history is provided by the patient.       Home Medications Prior to Admission medications   Medication Sig Start Date End Date Taking? Authorizing Provider  benzonatate  (TESSALON ) 100 MG capsule Take 1 capsule (100 mg total) by mouth every 8 (eight) hours. 04/14/23  Yes Jonathan Rodgers Macho, MD  oseltamivir  (TAMIFLU ) 75 MG capsule Take 1 capsule (75 mg total) by mouth every 12 (twelve) hours. 04/14/23  Yes Jonathan Rodgers Macho, MD  metoCLOPramide  (REGLAN ) 10 MG tablet Take 1 tablet (10 mg total) by mouth every 6 (six) hours as needed for nausea (or headache). 01/25/17   Jonathan Alm, MD      Allergies    Patient has no known allergies.    Review of Systems   Review of Systems  Constitutional:  Positive for fever.  Respiratory:  Positive for cough.   All other systems reviewed and are negative.   Physical Exam Updated Vital Signs BP (!) 147/94   Pulse 90   Temp (!) 100.4 F (38 C) (Oral)   Resp 18   Ht 5' 9 (1.753 m)   Wt 90.7 kg   SpO2 100%   BMI 29.53 kg/m  Physical Exam Vitals and nursing note reviewed.  Constitutional:      Appearance: Normal appearance.  HENT:     Head: Normocephalic.     Nose: Nose normal.     Mouth/Throat:     Comments: Mucous membranes slightly dry Eyes:     Extraocular Movements: Extraocular movements intact.     Pupils: Pupils are equal, round, and reactive to light.  Cardiovascular:     Rate and Rhythm: Normal rate and regular rhythm.     Pulses: Normal pulses.      Heart sounds: Normal heart sounds.  Pulmonary:     Effort: Pulmonary effort is normal.     Comments: Diminished bilaterally but no wheezing or crackles Abdominal:     General: Abdomen is flat.     Palpations: Abdomen is soft.  Musculoskeletal:        General: Normal range of motion.     Cervical back: Normal range of motion and neck supple.  Skin:    General: Skin is warm.     Capillary Refill: Capillary refill takes less than 2 seconds.  Neurological:     General: No focal deficit present.     Mental Status: He is alert.  Psychiatric:        Mood and Affect: Mood normal.     ED Results / Procedures / Treatments   Labs (all labs ordered are listed, but only abnormal results are displayed) Labs Reviewed  RESP PANEL BY RT-PCR (RSV, FLU A&B, COVID)  RVPGX2 - Abnormal; Notable for the following components:      Result Value   Influenza A by PCR POSITIVE (*)    All other components within normal limits    EKG None  Radiology No results found.  Procedures Procedures    Medications Ordered in ED Medications  acetaminophen  (TYLENOL ) tablet 1,000 mg (1,000 mg Oral Given 04/14/23 1413)    ED Course/ Medical Decision Making/ A&P                                 Medical Decision Making Jonathan Rodgers is a 33 y.o. male here presenting with fever and cough.  Likely flu syndrome.  Patient tested positive for flu A.  Patient is about 48 hours since his symptoms so I discussed risks and benefits for Tamiflu  and he would like to try it.  Also will get Tessalon  Perles as needed for cough.  Gave strict return precautions     Problems Addressed: Influenza A: acute illness or injury  Amount and/or Complexity of Data Reviewed Labs: ordered. Decision-making details documented in ED Course.  Risk Prescription drug management.    Final Clinical Impression(s) / ED Diagnoses Final diagnoses:  None    Rx / DC Orders ED Discharge Orders          Ordered    oseltamivir   (TAMIFLU ) 75 MG capsule  Every 12 hours        04/14/23 1713    benzonatate  (TESSALON ) 100 MG capsule  Every 8 hours        04/14/23 1713              Jonathan Rodgers Macho, MD 04/14/23 1718
